# Patient Record
Sex: Male | Born: 1955 | Race: Black or African American | Hispanic: No | Marital: Married | Smoking: Never smoker
Health system: Southern US, Community
[De-identification: ages and names within clinical notes are randomized; demographics above are authoritative.]

## PROBLEM LIST (undated history)

## (undated) DIAGNOSIS — IMO0001 Reserved for inherently not codable concepts without codable children: Secondary | ICD-10-CM

## (undated) DIAGNOSIS — E785 Hyperlipidemia, unspecified: Secondary | ICD-10-CM

## (undated) DIAGNOSIS — K219 Gastro-esophageal reflux disease without esophagitis: Secondary | ICD-10-CM

## (undated) DIAGNOSIS — R32 Unspecified urinary incontinence: Secondary | ICD-10-CM

## (undated) DIAGNOSIS — I10 Essential (primary) hypertension: Secondary | ICD-10-CM

---

## 2014-09-23 ENCOUNTER — Encounter (HOSPITAL_BASED_OUTPATIENT_CLINIC_OR_DEPARTMENT_OTHER): Payer: Self-pay | Admitting: Emergency Medicine

## 2014-09-23 ENCOUNTER — Emergency Department (HOSPITAL_BASED_OUTPATIENT_CLINIC_OR_DEPARTMENT_OTHER)
Admission: EM | Admit: 2014-09-23 | Discharge: 2014-09-23 | Disposition: A | Discharge status: Home / Self Care | Payer: BLUE CROSS/BLUE SHIELD | Attending: Emergency Medicine | Admitting: Emergency Medicine

## 2014-09-23 ENCOUNTER — Emergency Department (HOSPITAL_BASED_OUTPATIENT_CLINIC_OR_DEPARTMENT_OTHER): Payer: BLUE CROSS/BLUE SHIELD

## 2014-09-23 DIAGNOSIS — R091 Pleurisy: Secondary | ICD-10-CM | POA: Insufficient documentation

## 2014-09-23 DIAGNOSIS — Z880 Allergy status to penicillin: Secondary | ICD-10-CM | POA: Insufficient documentation

## 2014-09-23 DIAGNOSIS — Z8719 Personal history of other diseases of the digestive system: Secondary | ICD-10-CM | POA: Diagnosis not present

## 2014-09-23 DIAGNOSIS — R079 Chest pain, unspecified: Secondary | ICD-10-CM | POA: Insufficient documentation

## 2014-09-23 DIAGNOSIS — I10 Essential (primary) hypertension: Secondary | ICD-10-CM | POA: Diagnosis not present

## 2014-09-23 DIAGNOSIS — Z8639 Personal history of other endocrine, nutritional and metabolic disease: Secondary | ICD-10-CM | POA: Insufficient documentation

## 2014-09-23 DIAGNOSIS — R109 Unspecified abdominal pain: Secondary | ICD-10-CM | POA: Diagnosis present

## 2014-09-23 HISTORY — DX: Essential (primary) hypertension: I10

## 2014-09-23 HISTORY — DX: Reserved for inherently not codable concepts without codable children: IMO0001

## 2014-09-23 HISTORY — DX: Hyperlipidemia, unspecified: E78.5

## 2014-09-23 HISTORY — DX: Gastro-esophageal reflux disease without esophagitis: K21.9

## 2014-09-23 HISTORY — DX: Unspecified urinary incontinence: R32

## 2014-09-23 LAB — BASIC METABOLIC PANEL
ANION GAP: 8 (ref 5–15)
BUN: 19 mg/dL (ref 6–20)
CALCIUM: 9.1 mg/dL (ref 8.9–10.3)
CO2: 27 mmol/L (ref 22–32)
Chloride: 104 mmol/L (ref 101–111)
Creatinine, Ser: 1.11 mg/dL (ref 0.61–1.24)
GFR calc Af Amer: >60 mL/min (ref >60)
Glucose, Bld: 111 mg/dL — ABNORMAL HIGH (ref 65–99)
Potassium: 4.6 mmol/L (ref 3.5–5.1)
Sodium: 139 mmol/L (ref 135–145)

## 2014-09-23 LAB — CBC WITH DIFFERENTIAL/PLATELET
BASOS ABS: 0 10*3/uL (ref 0.0–0.1)
BASOS PCT: 0 % (ref 0–1)
EOS PCT: 2 % (ref 0–5)
Eosinophils Absolute: 0.1 10*3/uL (ref 0.0–0.7)
HEMATOCRIT: 44.4 % (ref 39.0–52.0)
HEMOGLOBIN: 14.6 g/dL (ref 13.0–17.0)
Lymphocytes Relative: 16 % (ref 12–46)
Lymphs Abs: 1.3 10*3/uL (ref 0.7–4.0)
MCH: 29.6 pg (ref 26.0–34.0)
MCHC: 32.9 g/dL (ref 30.0–36.0)
MCV: 89.9 fL (ref 78.0–100.0)
MONOS PCT: 11 % (ref 3–12)
Monocytes Absolute: 0.9 10*3/uL (ref 0.1–1.0)
Neutro Abs: 5.6 10*3/uL (ref 1.7–7.7)
Neutrophils Relative %: 71 % (ref 43–77)
Platelets: 177 10*3/uL (ref 150–400)
RBC: 4.94 MIL/uL (ref 4.22–5.81)
RDW: 14.4 % (ref 11.5–15.5)
WBC: 7.9 10*3/uL (ref 4.0–10.5)

## 2014-09-23 LAB — D-DIMER, QUANTITATIVE: D-Dimer, Quant: 0.75 ug/mL-FEU — ABNORMAL HIGH (ref 0.00–0.48)

## 2014-09-23 MED ORDER — FENTANYL CITRATE (PF) 100 MCG/2ML IJ SOLN
100.0000 ug | Freq: Once | INTRAMUSCULAR | Status: AC
  Administered 2014-09-23: 100 ug via INTRAVENOUS
  Filled 2014-09-23: qty 2

## 2014-09-23 MED ORDER — IOHEXOL 350 MG/ML SOLN
100.0000 mL | Freq: Once | INTRAVENOUS | Status: AC | PRN
  Administered 2014-09-23: 100 mL via INTRAVENOUS

## 2014-09-23 MED ORDER — SODIUM CHLORIDE 0.9 % IV SOLN
INTRAVENOUS | Status: DC
  Administered 2014-09-23: 1000 mL via INTRAVENOUS

## 2014-09-23 MED ORDER — HYDROCODONE-ACETAMINOPHEN 5-325 MG PO TABS: 1.0000 | ORAL_TABLET | Freq: Four times a day (QID) | ORAL | Status: AC | PRN

## 2014-09-23 NOTE — ED Notes (Signed)
Patient states that he is having a hard time "catching his breath" this evening after playing golf r/t to the pain in his left side and left flank area.

## 2014-09-23 NOTE — ED Notes (Signed)
C/o left side pain onset yesterday while playing golf,  Pain increased w movement and laying down, deep inspirations,

## 2014-09-23 NOTE — ED Provider Notes (Signed)
CSN: [id number]     Arrival date & time 09/23/14  0102 History   First MD Initiated Contact with Patient 09/23/14 0303     Chief Complaint  Patient presents with  . Flank Pain     (Consider location/radiation/quality/duration/timing/severity/associated sxs/prior Treatment) HPI  [name] who developed sharp, moderate to severe pain in his left posterior lateral chest. The onset was yesterday afternoon about 12:30 PM after playing golf. The pain is worse with deep breathing or coughing. He is not short of breath but he is taking shallow breaths because of the pain. He denies pain or swelling in his legs. He denies recent prolonged travel. He was treated recently for left knee sprain.  Past Medical History  Diagnosis Date  . Hypertension   . Hyperlipemia   . Reflux   . Lack of bladder control    History reviewed. No pertinent past surgical history. History reviewed. No pertinent family history. History  Substance Use Topics  . Smoking status: Never Smoker   . Smokeless tobacco: Not on file  . Alcohol Use: No    Review of Systems  All other systems reviewed and are negative.   Allergies  Penicillins  Home Medications   Prior to Admission medications   Not on File   BP 116/79 mmHg  Pulse 101  Temp(Src) 98.2 F (36.8 C) (Oral)  Resp 16  Ht 5\' 7"  (1.702 m)  Wt 210 lb (95.255 kg)  BMI 32.88 kg/m2  SpO2 96%   Physical Exam  General: Well-developed, well-nourished male in no acute distress; appearance consistent with age of record HENT: normocephalic; atraumatic Eyes: pupils equal, round and reactive to light; extraocular muscles intact; arcus senilis bilaterally Neck: supple Heart: regular rate and rhythm Lungs: Shallow breaths; a few rales in the bases Chest: Nontender Abdomen: soft; nondistended; nontender; no masses or hepatosplenomegaly; bowel sounds present Extremities: No deformity; full range of motion; pulses normal; no edema; no calf  tenderness Neurologic: Awake, alert and oriented; motor function intact in all extremities and symmetric; no facial droop Skin: Warm and dry Psychiatric: Normal mood and affect    ED Course  Procedures (including critical care time)   MDM  Nursing notes and vitals signs, including pulse oximetry, reviewed.  Summary of this visit's results, reviewed by myself:  Labs:  Results for orders placed or performed during the hospital encounter of 09/23/14 (from the past 24 hour(s))  CBC with Differential/Platelet     Status: None   Collection Time: 09/23/14  3:25 AM  Result Value Ref Range   WBC 7.9 4.0 - 10.5 K/uL   RBC 4.94 4.22 - 5.81 MIL/uL   Hemoglobin 14.6 13.0 - 17.0 g/dL   HCT 40.944.4 81.139.0 - 91.452.0 %   MCV 89.9 78.0 - 100.0 fL   MCH 29.6 26.0 - 34.0 pg   MCHC 32.9 30.0 - 36.0 g/dL   RDW 78.214.4 95.611.5 - 21.315.5 %   Platelets 177 150 - 400 K/uL   Neutrophils Relative % 71 43 - 77 %   Neutro Abs 5.6 1.7 - 7.7 K/uL   Lymphocytes Relative 16 12 - 46 %   Lymphs Abs 1.3 0.7 - 4.0 K/uL   Monocytes Relative 11 3 - 12 %   Monocytes Absolute 0.9 0.1 - 1.0 K/uL   Eosinophils Relative 2 0 - 5 %   Eosinophils Absolute 0.1 0.0 - 0.7 K/uL   Basophils Relative 0 0 - 1 %   Basophils Absolute 0.0 0.0 -  0.1 K/uL  D-dimer, quantitative     Status: Abnormal   Collection Time: 09/23/14  3:25 AM  Result Value Ref Range   D-Dimer, Quant 0.75 (H) 0.00 - 0.48 ug/mL-FEU  Basic metabolic panel     Status: Abnormal   Collection Time: 09/23/14  3:25 AM  Result Value Ref Range   Sodium 139 135 - 145 mmol/L   Potassium 4.6 3.5 - 5.1 mmol/L   Chloride 104 101 - 111 mmol/L   CO2 27 22 - 32 mmol/L   Glucose, Bld 111 (H) 65 - 99 mg/dL   BUN 19 6 - 20 mg/dL   Creatinine, Ser 1.61 0.61 - 1.24 mg/dL   Calcium 9.1 8.9 - 09.6 mg/dL   GFR calc non Af Amer >60 >60 mL/min   GFR calc Af Amer >60 >60 mL/min   Anion gap 8 5 - 15    Imaging Studies: Dg Chest 2 View  09/23/2014   CLINICAL DATA:  Shortness of breath  and left-sided chest pain.  EXAM: CHEST  2 VIEW  COMPARISON:  None.  FINDINGS: Streaky lung opacities, left greater than right with low lung volumes. No definitive pneumonia. Symmetric vascularity. No edema, effusion, or pneumothorax. Normal heart size and mediastinal contours.  IMPRESSION: Low lung volumes with bilateral atelectasis.   Electronically Signed   By: Marnee Spring M.D.   On: 09/23/2014 02:03   Ct Angio Chest Pe W/cm &/or Wo Cm  09/23/2014   CLINICAL DATA:  Chest pain.  Elevated D-dimer.  EXAM: CT ANGIOGRAPHY CHEST WITH CONTRAST  TECHNIQUE: Multidetector CT imaging of the chest was performed using the standard protocol during bolus administration of intravenous contrast. Multiplanar CT image reconstructions and MIPs were obtained to evaluate the vascular anatomy.  CONTRAST:  OMNIPAQUE IOHEXOL 350 MG/ML SOLN  COMPARISON:  None.  FINDINGS: THORACIC INLET/BODY WALL:  No acute abnormality.  MEDIASTINUM:  Normal heart size. No pericardial effusion. No acute vascular abnormality. CTA of the subsegmental pulmonary arteries is limited at the bases due to motion. No visible pulmonary embolism. No adenopathy. Mild herniation of fat through the esophageal hiatus.  LUNG WINDOWS:  Subsegmental atelectasis in the lower lungs. No pleural effusion or pneumonia. No air leak. Few emphysematous spaces.  UPPER ABDOMEN:  No acute findings.  OSSEOUS:  No acute fracture.  No suspicious lytic or blastic lesions.  Review of the MIP images confirms the above findings.  IMPRESSION: 1. No evidence of pulmonary embolism. 2. Bilateral subsegmental atelectasis. 3. Motion degradation at the bases.   Electronically Signed   By: Marnee Spring M.D.   On: 09/23/2014 04:32   CT scan reassuring. Patient's symptomatology consistent with pleurisy. We will provide analgesia and an incentive spirometer.   Paula Libra, MD 09/23/14 [phone]

## 2016-02-15 IMAGING — CT CT ANGIO CHEST
2 of 6 series · 19 of 36 positions shown · IV contrast (APPLIED)
Comparison: None.

CLINICAL DATA: Chest pain.  Elevated D-dimer.

EXAM:
CT ANGIOGRAPHY CHEST WITH CONTRAST
TECHNIQUE: Multidetector CT imaging of the chest was performed using the
standard protocol during bolus administration of intravenous
contrast. Multiplanar CT image reconstructions and MIPs were
obtained to evaluate the vascular anatomy.
CONTRAST:  100mL OMNIPAQUE IOHEXOL 350 MG/ML SOLN

[Series 5: pe 1.0 b26f · axial · 0.68mm/px · z∈[+1312,+1495]mm · 18 of 205 slices shown]
[im 11/205  lung]
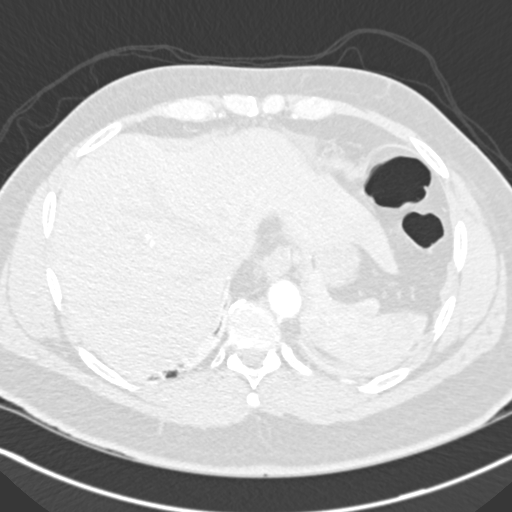
[im 21/205  mediastinal]
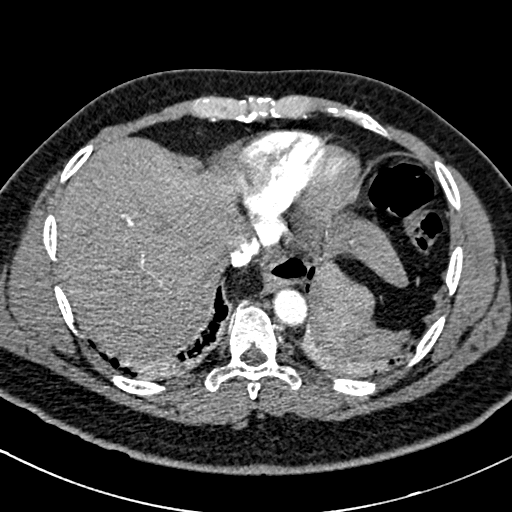
[im 31/205  lung]
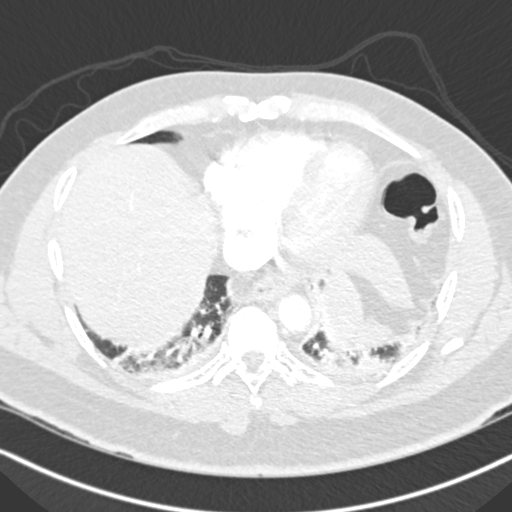
[im 41/205  mediastinal]
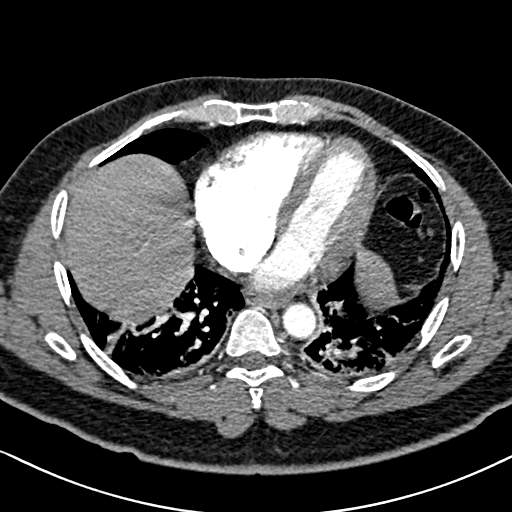
[im 52/205  lung]
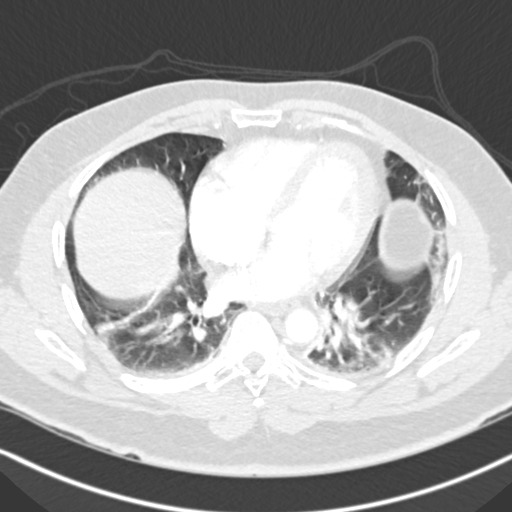
[im 62/205  mediastinal]
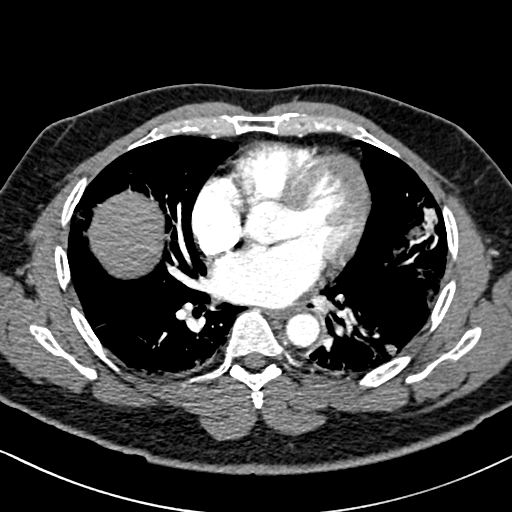
[im 72/205  lung]
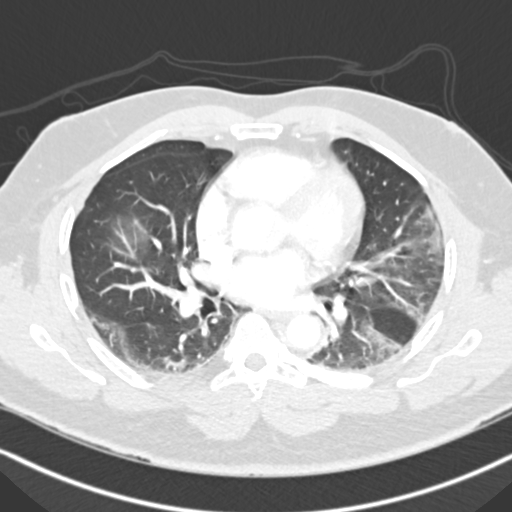
[im 82/205  mediastinal]
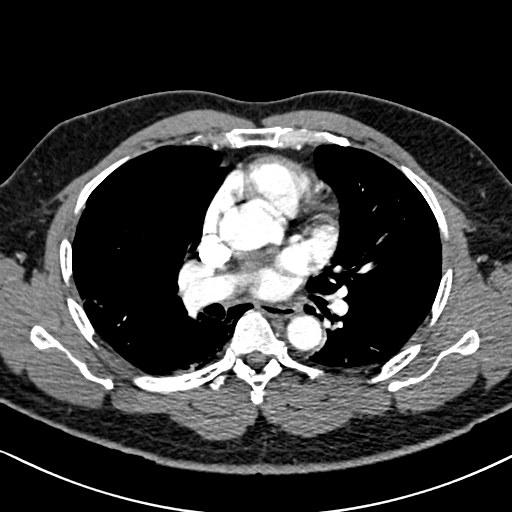
[im 92/205  lung]
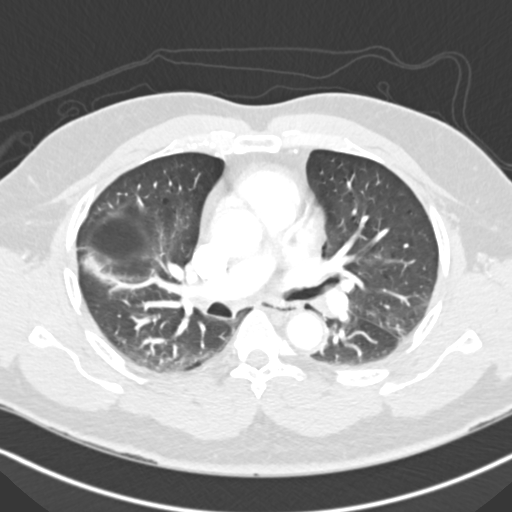
[im 113/205  mediastinal]
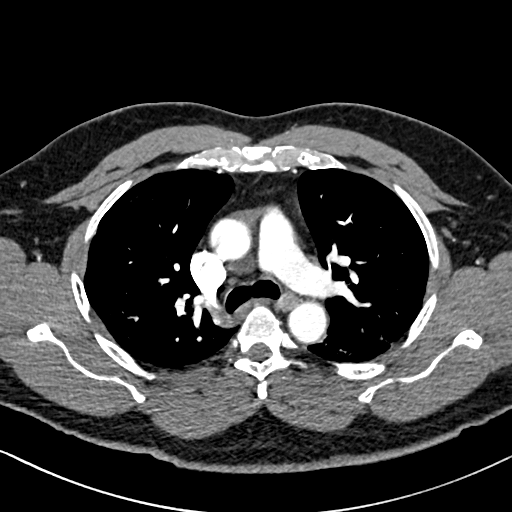
[im 123/205  lung]
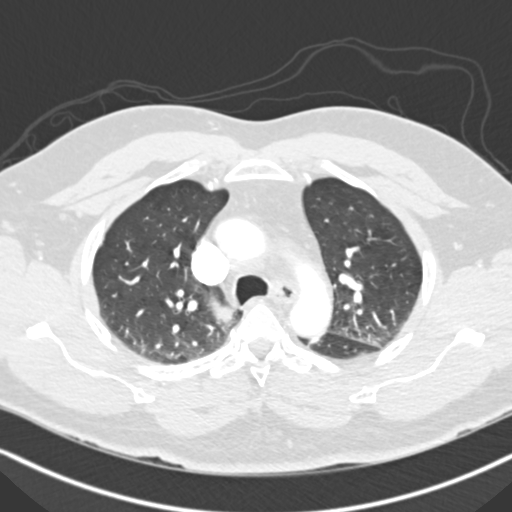
[im 133/205  mediastinal]
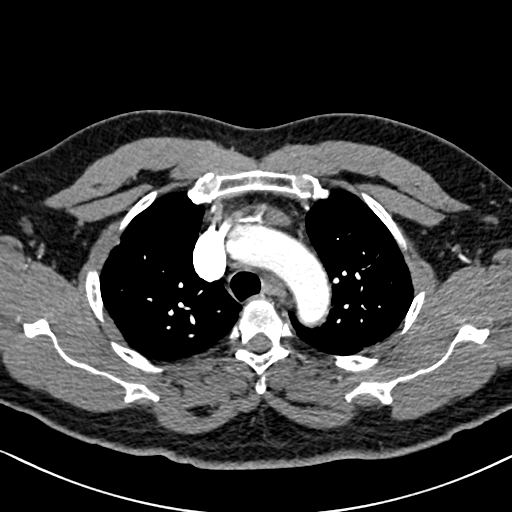
[im 143/205  lung]
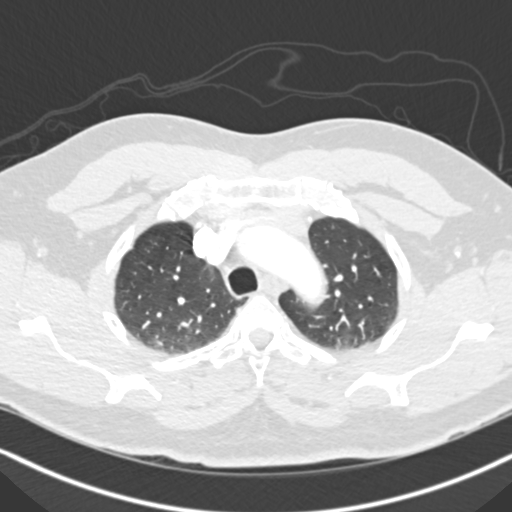
[im 154/205  mediastinal]
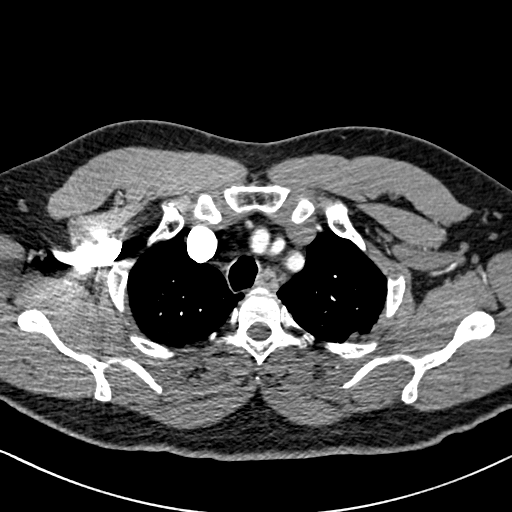
[im 164/205  lung]
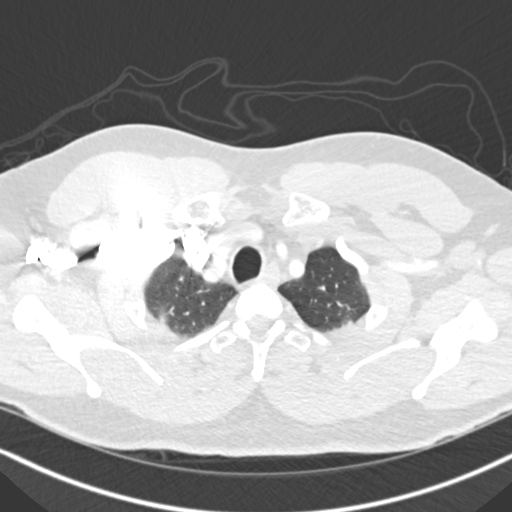
[im 174/205  mediastinal]
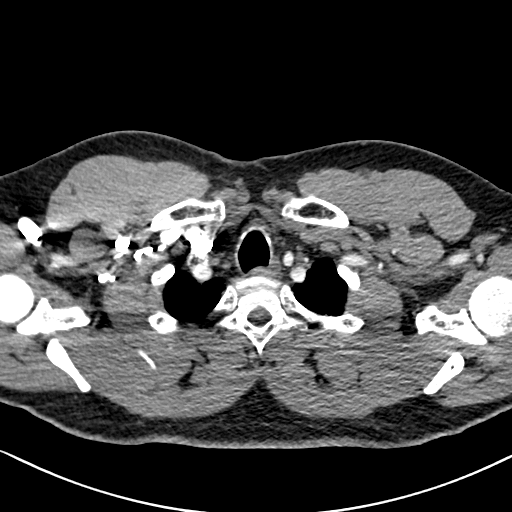
[im 184/205  lung]
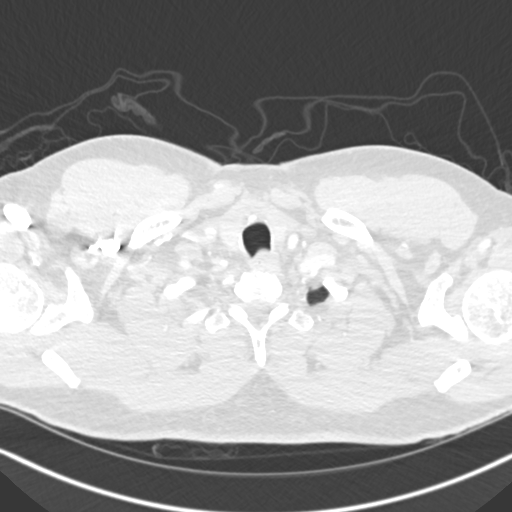
[im 194/205  mediastinal]
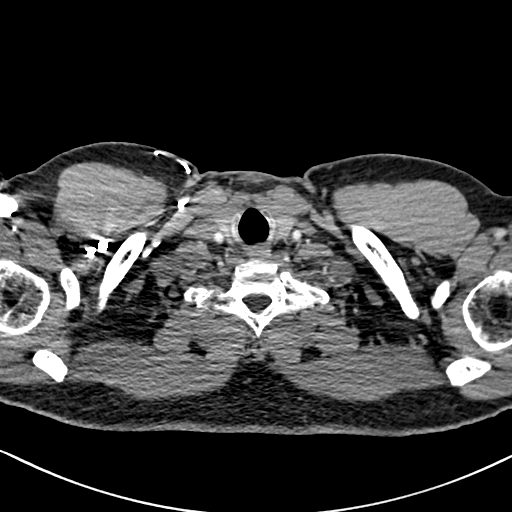

[Series 8: pe 2.0 coronal · coronal · 0.41mm/px · 1 of 127 slices shown]
[im 64/127  mediastinal]
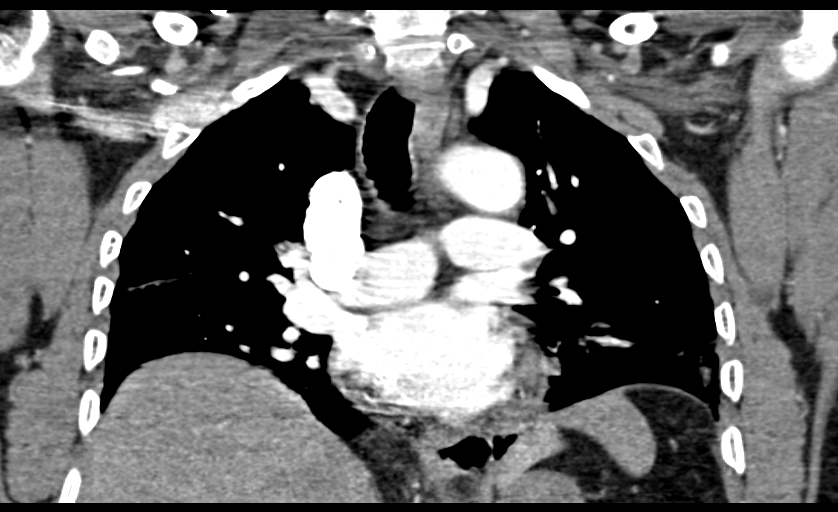

[19 of 36 positions shown; findings below may reference images not displayed]

FINDINGS: THORACIC INLET/BODY WALL:

No acute abnormality.

MEDIASTINUM:

Normal heart size. No pericardial effusion. No acute vascular
abnormality. CTA of the subsegmental pulmonary arteries is limited
at the bases due to motion. No visible pulmonary embolism. No
adenopathy. Mild herniation of fat through the esophageal hiatus.

LUNG WINDOWS:

Subsegmental atelectasis in the lower lungs. No pleural effusion or
pneumonia. No air leak. Few emphysematous spaces.

UPPER ABDOMEN:

No acute findings.

OSSEOUS:

No acute fracture.  No suspicious lytic or blastic lesions.

Review of the MIP images confirms the above findings.
IMPRESSION: 1. No evidence of pulmonary embolism.
2. Bilateral subsegmental atelectasis.
3. Motion degradation at the bases.

## 2019-07-06 ENCOUNTER — Ambulatory Visit: Payer: BLUE CROSS/BLUE SHIELD | Attending: Internal Medicine

## 2019-07-06 DIAGNOSIS — Z23 Encounter for immunization: Secondary | ICD-10-CM | POA: Insufficient documentation

## 2019-07-06 NOTE — Progress Notes (Signed)
   Covid-19 Vaccination Clinic  Name:  George Robinson    MRN: [id number] DOB: 03/06/1956  07/06/2019  [name] was observed post Covid-19 immunization for 15 minutes without incident. He was provided with Vaccine Information Sheet and instruction to access the V-Safe system.   [name] was instructed to call 911 with any severe reactions post vaccine: Marland Kitchen Difficulty breathing  . Swelling of face and throat  . A fast heartbeat  . A bad rash all over body  . Dizziness and weakness   Immunizations Administered    Name Date Dose VIS Date Route   Pfizer COVID-19 Vaccine 07/06/2019  8:34 AM 0.3 mL 04/12/2019 Intramuscular   Manufacturer: [employer], [employer]   Lot: [id number]   NDC: [address]-5

## 2019-07-27 ENCOUNTER — Ambulatory Visit: Payer: BLUE CROSS/BLUE SHIELD | Attending: Internal Medicine

## 2019-07-27 DIAGNOSIS — Z23 Encounter for immunization: Secondary | ICD-10-CM

## 2019-07-27 NOTE — Progress Notes (Signed)
   Covid-19 Vaccination Clinic  Name:  Kaiser Belluomini    MRN: [id number] DOB: Jan 31, 1956  07/27/2019  Mr. Uhls was observed post Covid-19 immunization for 15 minutes without incident. He was provided with Vaccine Information Sheet and instruction to access the V-Safe system.   Mr. Starliper was instructed to call 911 with any severe reactions post vaccine: Marland Kitchen Difficulty breathing  . Swelling of face and throat  . A fast heartbeat  . A bad rash all over body  . Dizziness and weakness   Immunizations Administered    Name Date Dose VIS Date Route   Pfizer COVID-19 Vaccine 07/27/2019  8:29 AM 0.3 mL 04/12/2019 Intramuscular   Manufacturer: [employer], [employer]   Lot: [id number]   NDC: [address]-0

## 2021-04-24 ENCOUNTER — Encounter (HOSPITAL_BASED_OUTPATIENT_CLINIC_OR_DEPARTMENT_OTHER): Payer: Self-pay

## 2021-04-24 ENCOUNTER — Emergency Department (HOSPITAL_BASED_OUTPATIENT_CLINIC_OR_DEPARTMENT_OTHER)
Admission: EM | Admit: 2021-04-24 | Discharge: 2021-04-24 | Disposition: A | Discharge status: Home / Self Care | Payer: BC Managed Care – PPO | Attending: Emergency Medicine | Admitting: Emergency Medicine

## 2021-04-24 ENCOUNTER — Other Ambulatory Visit: Payer: Self-pay

## 2021-04-24 ENCOUNTER — Emergency Department (HOSPITAL_BASED_OUTPATIENT_CLINIC_OR_DEPARTMENT_OTHER): Payer: BC Managed Care – PPO

## 2021-04-24 DIAGNOSIS — I10 Essential (primary) hypertension: Secondary | ICD-10-CM | POA: Diagnosis not present

## 2021-04-24 DIAGNOSIS — J069 Acute upper respiratory infection, unspecified: Secondary | ICD-10-CM | POA: Diagnosis not present

## 2021-04-24 DIAGNOSIS — Z20822 Contact with and (suspected) exposure to covid-19: Secondary | ICD-10-CM | POA: Diagnosis not present

## 2021-04-24 DIAGNOSIS — R059 Cough, unspecified: Secondary | ICD-10-CM | POA: Diagnosis present

## 2021-04-24 LAB — RESP PANEL BY RT-PCR (FLU A&B, COVID) ARPGX2
Influenza A by PCR: NEGATIVE
Influenza B by PCR: NEGATIVE
SARS Coronavirus 2 by RT PCR: NEGATIVE

## 2021-04-24 MED ORDER — BENZONATATE 100 MG PO CAPS: 100.0000 mg | ORAL_CAPSULE | Freq: Three times a day (TID) | ORAL | 0 refills | Status: AC

## 2021-04-24 NOTE — ED Triage Notes (Signed)
Pt c/o scratchy throat x 2 days, cough since yesterday. States some shortness of breath after coughing when exerting himself. Denies sick contacts. NAD during triage.

## 2021-04-24 NOTE — Discharge Instructions (Signed)
Your respiratory panel today was negative for COVID or flu.  Your chest x-ray was negative for pneumonia.  I have sent in cough medicine to the pharmacy for you.  Continue using Mucinex and cough drops.  If you develop worsening shortness of breath please return to the emergency room.  Otherwise you can follow-up with your primary care provider if you have ongoing cough that does not improve in the next several days.

## 2021-04-24 NOTE — ED Provider Notes (Signed)
[facility] EMERGENCY DEPARTMENT Provider Note   CSN: [id number] Arrival date & time: 04/24/21  1152     History Chief Complaint  Patient presents with   Cough    George Robinson is a 65 y.o. male.  65 year old male presents today for evaluation of 2-day duration of cough, sore throat, headache, sinus congestion.  He reports his cough is nonproductive.  He reports resolution of headache, sore throat.  Does report ongoing mild sinus congestion.  He denies fever, chills, chest pain.  He does endorse mild shortness of breath after coughing spells but otherwise denies shortness of breath at rest or with exertion.  States he presented today because his family wanted him to be evaluated for pneumonia given he had pneumonia earlier this year.  The history is provided by the patient. No language interpreter was used.      Past Medical History:  Diagnosis Date   Hyperlipemia    Hypertension    Lack of bladder control    Reflux     There are no problems to display for this patient.   History reviewed. No pertinent surgical history.     History reviewed. No pertinent family history.  Social History   Tobacco Use   Smoking status: Never  Substance Use Topics   Alcohol use: No   Drug use: No    Home Medications Prior to Admission medications   Medication Sig Start Date End Date Taking? Authorizing Provider  HYDROcodone-acetaminophen (NORCO/VICODIN) 5-325 MG per tablet Take 1-2 tablets by mouth every 6 (six) hours as needed (for pain). 09/23/14   Molpus, John, MD    Allergies    Penicillins  Review of Systems   Review of Systems  Constitutional:  Negative for chills and fever.  HENT:  Positive for congestion. Negative for sore throat.   Eyes:  Negative for visual disturbance.  Respiratory:  Positive for cough. Negative for shortness of breath.   Cardiovascular:  Negative for chest pain.  Gastrointestinal:  Negative for abdominal pain, nausea and vomiting.   Neurological:  Negative for headaches.  All other systems reviewed and are negative.  Physical Exam Updated Vital Signs BP (!) 128/91 (BP Location: Right Arm)    Pulse 84    Temp 97.9 F (36.6 C) (Oral)    Resp (!) 22    Ht 5\' 7"  (1.702 m)    Wt 96.2 kg    SpO2 96%    BMI 33.20 kg/m   Physical Exam Vitals and nursing note reviewed.  Constitutional:      General: He is not in acute distress.    Appearance: Normal appearance. He is not ill-appearing.  HENT:     Head: Normocephalic and atraumatic.     Nose: Nose normal.  Eyes:     General: No scleral icterus.    Extraocular Movements: Extraocular movements intact.     Conjunctiva/sclera: Conjunctivae normal.  Cardiovascular:     Rate and Rhythm: Normal rate and regular rhythm.     Pulses: Normal pulses.     Heart sounds: Normal heart sounds.  Pulmonary:     Effort: Pulmonary effort is normal. No respiratory distress.     Breath sounds: Normal breath sounds. No wheezing or rales.  Abdominal:     General: There is no distension.     Tenderness: There is no abdominal tenderness.  Musculoskeletal:        General: Normal range of motion.     Cervical back: Normal range of motion.  Right lower leg: No edema.     Left lower leg: No edema.  Skin:    General: Skin is warm and dry.  Neurological:     General: No focal deficit present.     Mental Status: He is alert. Mental status is at baseline.    ED Results / Procedures / Treatments   Labs (all labs ordered are listed, but only abnormal results are displayed) Labs Reviewed  RESP PANEL BY RT-PCR (FLU A&B, COVID) [id number]    EKG None  Radiology No results found.  Procedures Procedures   Medications Ordered in ED Medications - No data to display  ED Course  I have reviewed the triage vital signs and the nursing notes.  Pertinent labs & imaging results that were available during my care of the patient were reviewed by me and considered in my medical decision  making (see chart for details).    MDM Rules/Calculators/A&P                         65 year old male presents today for evaluation of cough today duration.  He also endorses other URI symptoms such as sore throat, headache, sinus congestion which has improved he has ongoing dry cough.  Patient has history of pneumonia and his family was concerned and brought him in for evaluation for pneumonia.  Patient's cough is nonproductive.  He denies fever.  Chest x-ray without concern for pneumonia.  Respiratory panel negative for COVID or flu.  Patient ambulated within the room for about 3 minutes with maintaining sats above 98% on room air.  Will prescribe Tessalon Perles.  Symptomatic treatment discussed.  Return precautions discussed.  Patient voices understanding and is in agreement with plan.      Final Clinical Impression(s) / ED Diagnoses Final diagnoses:  Viral URI with cough    Rx / DC Orders ED Discharge Orders          Ordered    benzonatate (TESSALON) 100 MG capsule  Every 8 hours        04/24/21 1349             Marita Kansas, PA-C 04/24/21 1350    Virgina Norfolk, DO 04/24/21 1503

## 2022-09-16 IMAGING — DX DG CHEST 1V PORT
1 series · 1 of 1 positions shown · non-contrast
Comparison: Previous studies including the examination of
01/30/2020

CLINICAL DATA: Cough, sore throat shortness of breath

EXAM:
PORTABLE CHEST 1 VIEW

[chest ap]
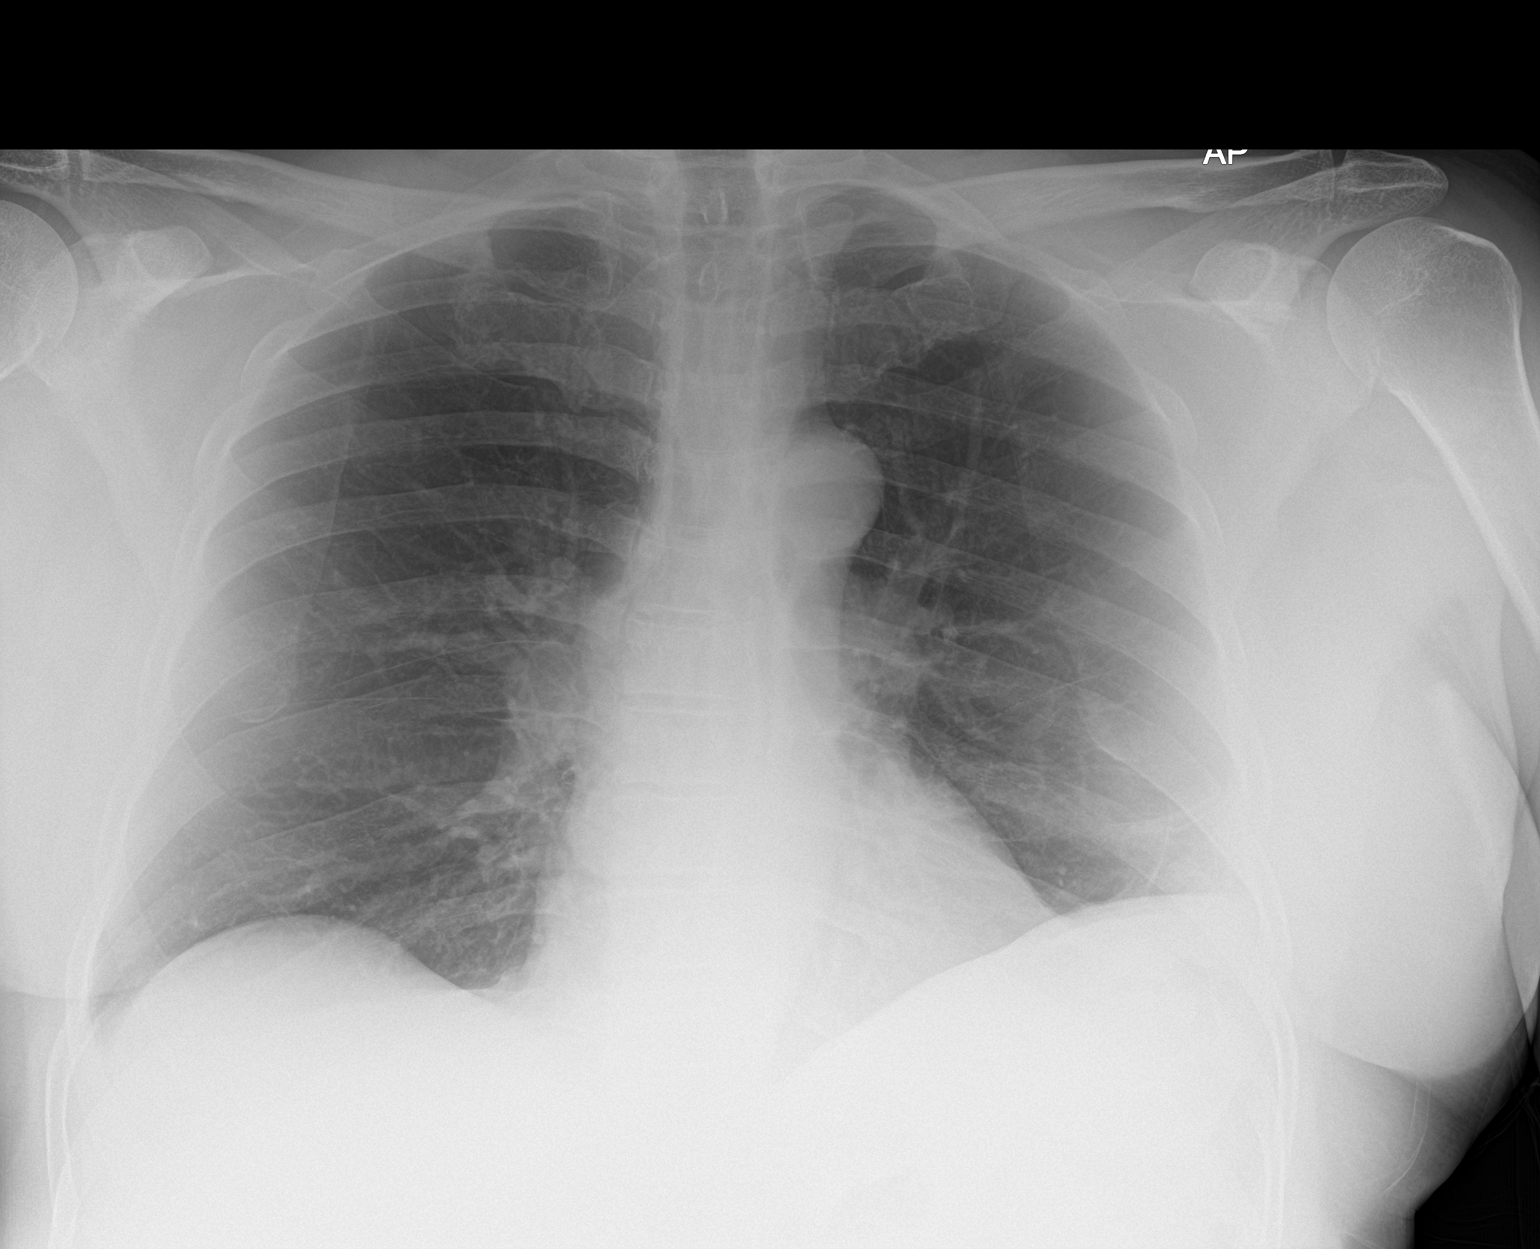

[1 of 1 positions shown; findings below may reference images not displayed]

FINDINGS: Cardiac size is within normal limits. There are no signs of
pulmonary edema or new focal infiltrates. Small linear densities in
the left lower lung fields and blunting of left lateral CP angle
have not changed suggesting scarring. There is no pneumothorax.
IMPRESSION: There are no signs of pulmonary edema or new focal infiltrates.
